# Patient Record
Sex: Male | Born: 1995 | Race: White | Hispanic: No | Marital: Single | Smoking: Never smoker
Health system: Southern US, Community
[De-identification: ages and names within clinical notes are randomized; demographics above are authoritative.]

---

## 2018-09-18 ENCOUNTER — Other Ambulatory Visit (HOSPITAL_COMMUNITY): Payer: Self-pay | Admitting: Family Medicine

## 2018-09-18 ENCOUNTER — Other Ambulatory Visit: Payer: Self-pay | Admitting: Family Medicine

## 2018-09-18 DIAGNOSIS — R319 Hematuria, unspecified: Secondary | ICD-10-CM

## 2018-09-26 ENCOUNTER — Ambulatory Visit: Admission: RE | Admit: 2018-09-26 | Payer: BLUE CROSS/BLUE SHIELD | Source: Ambulatory Visit

## 2018-11-07 ENCOUNTER — Ambulatory Visit
Admission: RE | Admit: 2018-11-07 | Discharge: 2018-11-07 | Disposition: A | Discharge status: Home / Self Care | Payer: BC Managed Care – PPO | Source: Ambulatory Visit | Attending: Family Medicine | Admitting: Family Medicine

## 2018-11-07 ENCOUNTER — Other Ambulatory Visit: Payer: Self-pay

## 2018-11-07 DIAGNOSIS — R319 Hematuria, unspecified: Secondary | ICD-10-CM | POA: Diagnosis not present

## 2018-11-07 MED ORDER — IOHEXOL 300 MG/ML  SOLN
125.0000 mL | Freq: Once | INTRAMUSCULAR | Status: AC | PRN
  Administered 2018-11-07: 125 mL via INTRAVENOUS

## 2020-05-21 IMAGING — CT CT ABDOMEN AND PELVIS WITHOUT AND WITH CONTRAST
2 of 8 series · 14 of 46 positions shown, 16 images · IV contrast (omnipaque)
Comparison: No priors.

CLINICAL DATA: 23-year-old male with history of microscopic
hematuria.

EXAM:
CT ABDOMEN AND PELVIS WITHOUT AND WITH CONTRAST
TECHNIQUE: Multidetector CT imaging of the abdomen and pelvis was performed
following the standard protocol before and following the bolus
administration of intravenous contrast.
CONTRAST:  125mL OMNIPAQUE IOHEXOL 300 MG/ML  SOLN

[Series 3: axial without without pre · axial · non-contrast · 0.66mm/px · z∈[-1571,-1191]mm · 11 of 92 slices shown, 13 images]
[im 8/92  soft-tissue]
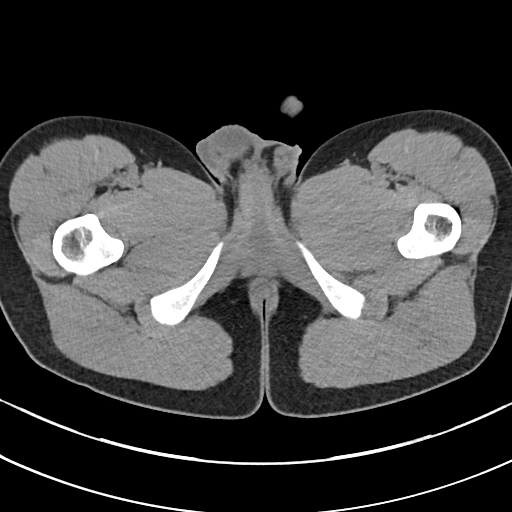
[im 8/92  bone]
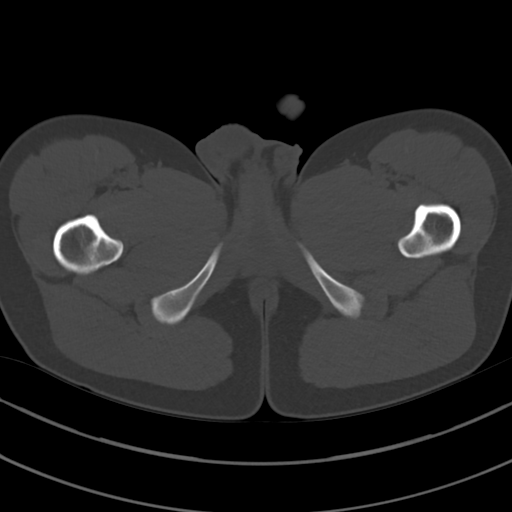
[im 16/92  soft-tissue]
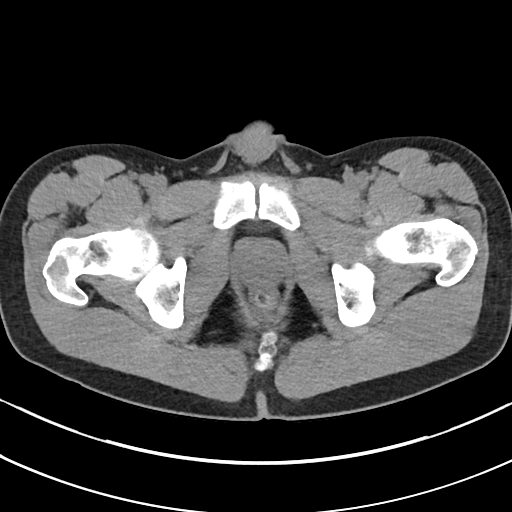
[im 23/92  soft-tissue]
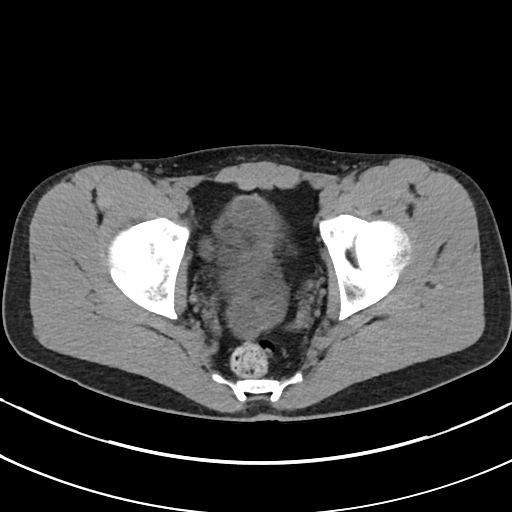
[im 31/92  soft-tissue]
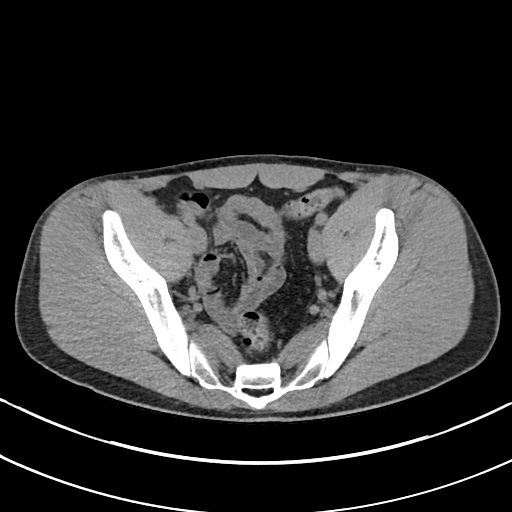
[im 38/92  soft-tissue]
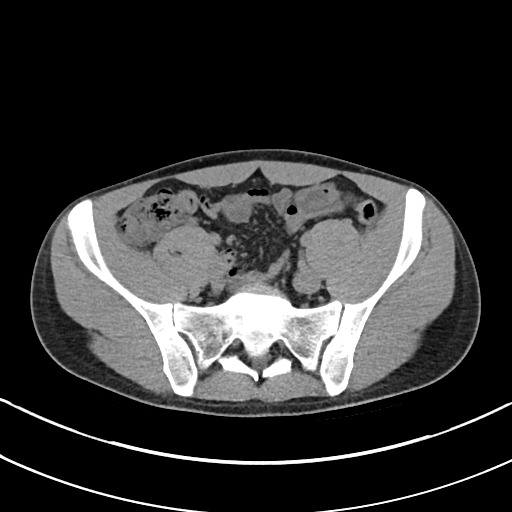
[im 46/92  soft-tissue]
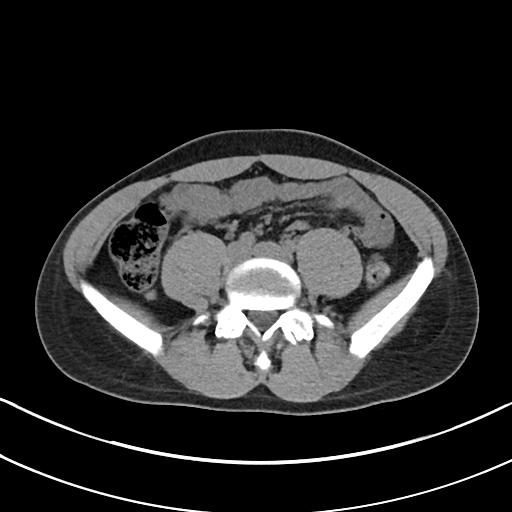
[im 54/92  soft-tissue]
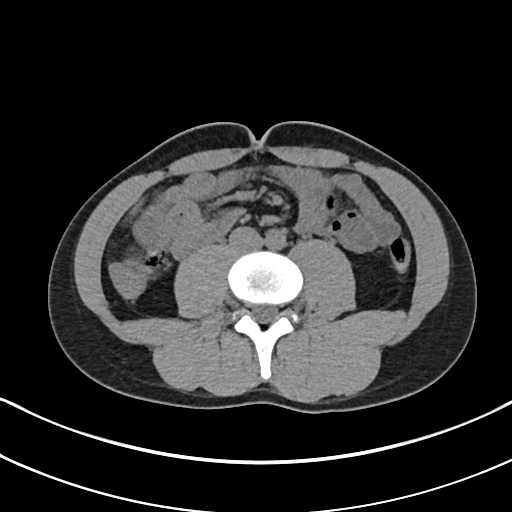
[im 61/92  soft-tissue]
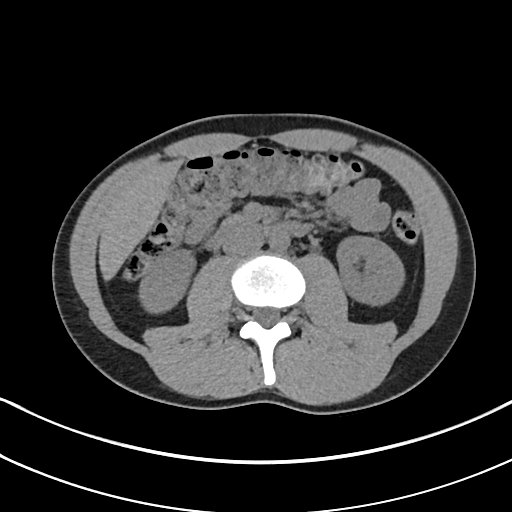
[im 69/92  soft-tissue]
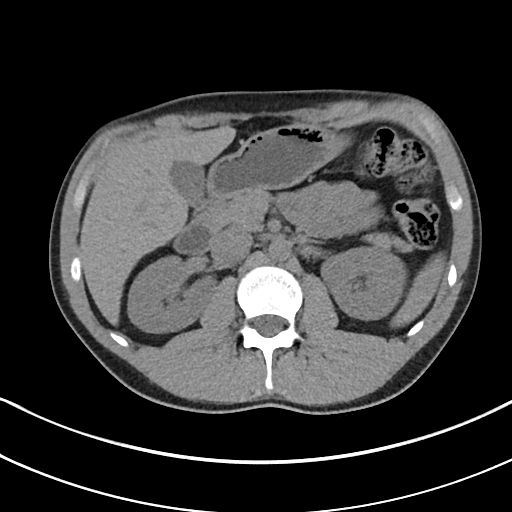
[im 69/92  bone]
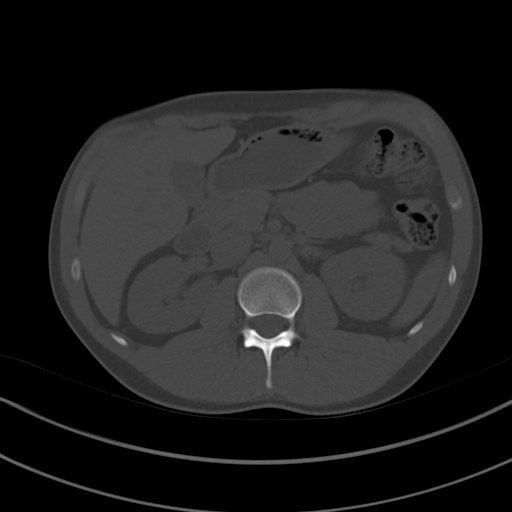
[im 76/92  soft-tissue]
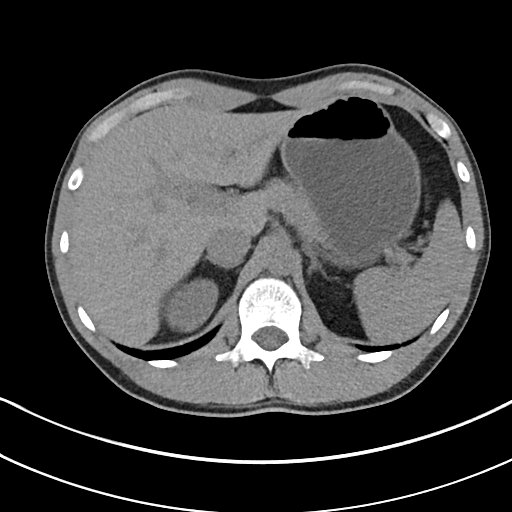
[im 84/92  soft-tissue]
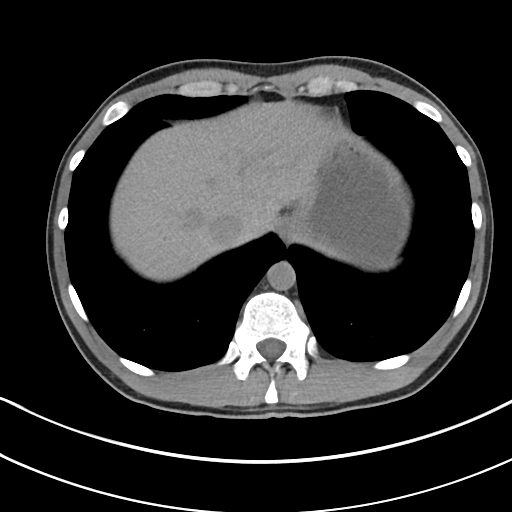

[Series 6: cor without without pre · coronal · non-contrast · 0.66mm/px · 3 of 123 slices shown]
[im 31/123  soft-tissue]
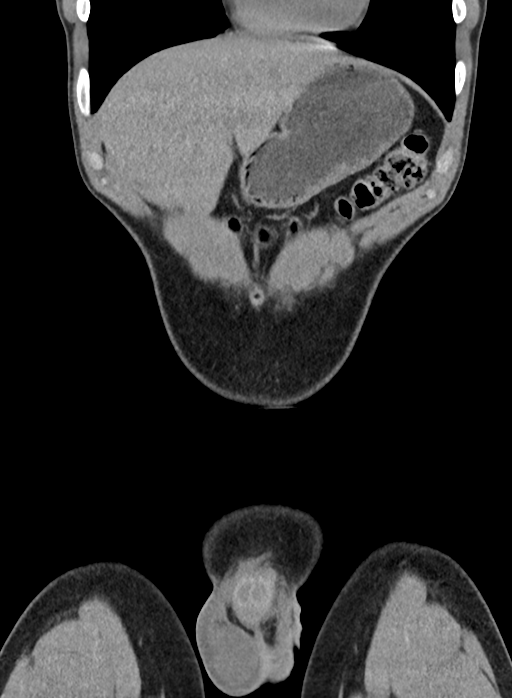
[im 62/123  soft-tissue]
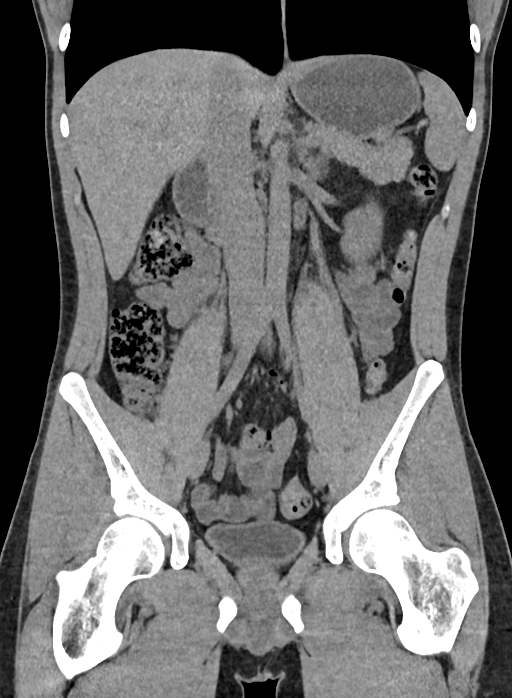
[im 92/123  soft-tissue]
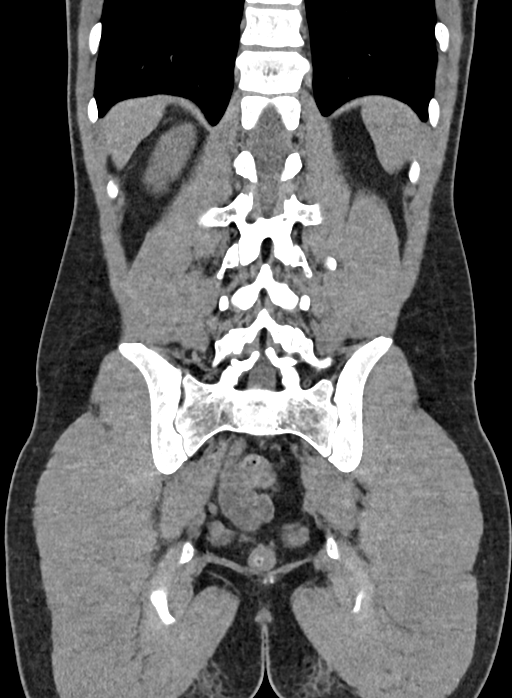

[14 of 46 positions shown; findings below may reference images not displayed]

FINDINGS: Lower chest: Unremarkable.

Hepatobiliary: No suspicious cystic or solid hepatic lesions. No
intra or extrahepatic biliary ductal dilatation. Gallbladder is
normal in appearance.

Pancreas: No pancreatic mass. No pancreatic ductal dilatation. No
pancreatic or peripancreatic fluid or inflammatory changes.

Spleen: Unremarkable.

Adrenals/Urinary Tract: Precontrast images demonstrate no
calcifications within the collecting system of either kidney, along
the course of either ureter, or within the lumen of the urinary
bladder. No hydroureteronephrosis. Post contrast images demonstrate
no focal solid renal lesions. Post contrast delayed images
demonstrate no definite filling defects within the collecting system
of either kidney, along the course of either ureter, or within the
lumen of the urinary bladder to strongly suggest the presence of a
urothelial neoplasm. Urinary bladder is normal in appearance.
Bilateral adrenal glands are normal in appearance.

Stomach/Bowel: Normal appearance of the stomach. No pathologic
dilatation of small bowel or colon. Normal appendix.

Vascular/Lymphatic: No significant atherosclerotic disease, aneurysm
or dissection noted in the abdominal or pelvic vasculature.
Circumaortic left renal vein (normal anatomical variant)
incidentally noted. No lymphadenopathy noted in the abdomen or
pelvis.

Reproductive: Prostate gland and seminal vesicles are unremarkable
in appearance.

Other: No significant volume of ascites.  No pneumoperitoneum.

Musculoskeletal: There are no aggressive appearing lytic or blastic
lesions noted in the visualized portions of the skeleton.
IMPRESSION: 1. No explanation for the patient's history of microscopic
hematuria. Specifically, no urinary tract calculi no findings of
urinary tract obstruction are noted at this time.

## 2021-09-02 ENCOUNTER — Encounter: Payer: Self-pay | Admitting: Urology

## 2021-09-02 ENCOUNTER — Ambulatory Visit (INDEPENDENT_AMBULATORY_CARE_PROVIDER_SITE_OTHER): Payer: BC Managed Care – PPO | Admitting: Urology

## 2021-09-02 VITALS — BP 137/82 | HR 69 | Ht 70.0 in | Wt 150.0 lb

## 2021-09-02 DIAGNOSIS — R310 Gross hematuria: Secondary | ICD-10-CM

## 2021-09-02 DIAGNOSIS — R3121 Asymptomatic microscopic hematuria: Secondary | ICD-10-CM | POA: Diagnosis not present

## 2021-09-02 LAB — URINALYSIS, COMPLETE
Bilirubin, UA: NEGATIVE
Glucose, UA: NEGATIVE
Ketones, UA: NEGATIVE
Leukocytes,UA: NEGATIVE
Nitrite, UA: NEGATIVE
Protein,UA: NEGATIVE
Specific Gravity, UA: 1.01 (ref 1.005–1.030)
Urobilinogen, Ur: 0.2 mg/dL (ref 0.2–1.0)
pH, UA: 7 (ref 5.0–7.5)

## 2021-09-02 LAB — MICROSCOPIC EXAMINATION
Bacteria, UA: NONE SEEN
Epithelial Cells (non renal): NONE SEEN /hpf (ref 0–10)

## 2021-09-02 NOTE — Progress Notes (Signed)
? ?  09/02/21 ?2:31 PM  ? ?Kyle Bowers ?1995-12-21 ?[id number] ? ?CC: Asymptomatic microscopic hematuria ? ?HPI: ?I saw Kyle Bowers today for evaluation of asymptomatic microscopic hematuria.  He is a healthy 26 year old male who reports he has had microscopic hematuria on urine sample since he was a teenager.  He denies any history of gross hematuria.  He had a CT urogram in June 2020 which was benign.  He denies any urinary symptoms or history of kidney stones.  He has a sister and father who have a long history of asymptomatic microscopic hematuria as well with negative work-up. ? ?Urinalysis today is pending ? ?Social History:  reports that he has never smoked. He has never been exposed to tobacco smoke. He has never used smokeless tobacco. No history on file for alcohol use and drug use. ? ?Physical Exam: ?BP 137/82   Pulse 69   Ht 5\' 10"  (1.778 m)   Wt 150 lb (68 kg)   BMI 21.52 kg/m?   ? ?Constitutional:  Alert and oriented, No acute distress. ?Cardiovascular: No clubbing, cyanosis, or edema. ?Respiratory: Normal respiratory effort, no increased work of breathing. ?GI: Abdomen is soft, nontender, nondistended, no abdominal masses ? 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 ? ?We discussed common possible etiologies of microscopic hematuria including idiopathic, urolithiasis, medical renal disease, and malignancy. We discussed the new asymptomatic microscopic hematuria guidelines and risk categories of low, intermediate, and high risk that are based on age, risk factors like smoking, and degree of microscopic hematuria. We discussed work-up can range from repeat urinalysis, renal ultrasound and cystoscopy, to CT urogram and cystoscopy. ? ?-Using shared decision making he opted to defer cystoscopy which is very  reasonable.  Return precautions discussed extensively including gross hematuria or new urinary symptoms. ? ? ?02-05-1975, MD ?09/02/2021 ? ?[facility] ?[address], [address] ?[place], [place] ?[phone] ? ? ?

## 2021-09-03 ENCOUNTER — Telehealth: Payer: Self-pay

## 2021-09-03 NOTE — Telephone Encounter (Signed)
-----   Message from Sondra Come, MD sent at 09/02/2021  4:52 PM EDT ----- ?Good news, microscopic hematuria back down to the low value he has had long-term of 3-10 RBCs, can follow-up with PCP, no urology follow-up needed unless new gross hematuria ? ?Legrand Rams, MD ?09/02/2021 ? ? ?

## 2021-09-03 NOTE — Telephone Encounter (Signed)
Called pt informed him of the information below. Pt voiced understanding.  

## 2022-02-10 ENCOUNTER — Ambulatory Visit
Admission: RE | Admit: 2022-02-10 | Discharge: 2022-02-10 | Disposition: A | Discharge status: Home / Self Care | Payer: BC Managed Care – PPO | Source: Ambulatory Visit | Attending: Family Medicine | Admitting: Family Medicine

## 2022-02-10 ENCOUNTER — Other Ambulatory Visit (HOSPITAL_COMMUNITY): Payer: Self-pay | Admitting: Family Medicine

## 2022-02-10 ENCOUNTER — Other Ambulatory Visit: Payer: Self-pay | Admitting: Family Medicine

## 2022-02-10 DIAGNOSIS — H53123 Transient visual loss, bilateral: Secondary | ICD-10-CM
